# Patient Record
Sex: Female | Born: 1965 | Race: Black or African American | Hispanic: No | Marital: Married | Smoking: Never smoker
Health system: Southern US, Community
[De-identification: ages and names within clinical notes are randomized; demographics above are authoritative.]

## PROBLEM LIST (undated history)

## (undated) DIAGNOSIS — E079 Disorder of thyroid, unspecified: Secondary | ICD-10-CM

## (undated) DIAGNOSIS — I10 Essential (primary) hypertension: Secondary | ICD-10-CM

## (undated) DIAGNOSIS — N63 Unspecified lump in unspecified breast: Secondary | ICD-10-CM

## (undated) DIAGNOSIS — T7840XA Allergy, unspecified, initial encounter: Secondary | ICD-10-CM

## (undated) HISTORY — PX: WISDOM TOOTH EXTRACTION: SHX21

## (undated) HISTORY — DX: Disorder of thyroid, unspecified: E07.9

## (undated) HISTORY — DX: Essential (primary) hypertension: I10

## (undated) HISTORY — DX: Allergy, unspecified, initial encounter: T78.40XA

---

## 1998-08-13 ENCOUNTER — Other Ambulatory Visit: Admission: RE | Admit: 1998-08-13 | Discharge: 1998-08-13 | Payer: Self-pay | Admitting: Obstetrics and Gynecology

## 1998-08-31 ENCOUNTER — Other Ambulatory Visit: Admission: RE | Admit: 1998-08-31 | Discharge: 1998-08-31 | Payer: Self-pay | Admitting: *Deleted

## 1998-12-25 ENCOUNTER — Other Ambulatory Visit: Admission: RE | Admit: 1998-12-25 | Discharge: 1998-12-25 | Payer: Self-pay | Admitting: Obstetrics and Gynecology

## 1999-02-07 ENCOUNTER — Other Ambulatory Visit: Admission: RE | Admit: 1999-02-07 | Discharge: 1999-02-07 | Payer: Self-pay | Admitting: Obstetrics and Gynecology

## 1999-09-11 ENCOUNTER — Other Ambulatory Visit: Admission: RE | Admit: 1999-09-11 | Discharge: 1999-09-11 | Payer: Self-pay | Admitting: Obstetrics and Gynecology

## 2000-09-25 ENCOUNTER — Other Ambulatory Visit: Admission: RE | Admit: 2000-09-25 | Discharge: 2000-09-25 | Payer: Self-pay | Admitting: Obstetrics and Gynecology

## 2001-07-23 ENCOUNTER — Inpatient Hospital Stay (HOSPITAL_COMMUNITY): Admission: AD | Admit: 2001-07-23 | Discharge: 2001-07-23 | Payer: Self-pay | Admitting: Obstetrics & Gynecology

## 2001-08-01 ENCOUNTER — Inpatient Hospital Stay (HOSPITAL_COMMUNITY): Admission: AD | Admit: 2001-08-01 | Discharge: 2001-08-05 | Payer: Self-pay | Admitting: Obstetrics and Gynecology

## 2002-09-01 ENCOUNTER — Other Ambulatory Visit: Admission: RE | Admit: 2002-09-01 | Discharge: 2002-09-01 | Payer: Self-pay | Admitting: Obstetrics and Gynecology

## 2003-09-19 ENCOUNTER — Other Ambulatory Visit: Admission: RE | Admit: 2003-09-19 | Discharge: 2003-09-19 | Payer: Self-pay | Admitting: Obstetrics and Gynecology

## 2004-12-13 ENCOUNTER — Other Ambulatory Visit: Admission: RE | Admit: 2004-12-13 | Discharge: 2004-12-13 | Payer: Self-pay | Admitting: Obstetrics and Gynecology

## 2005-03-19 ENCOUNTER — Ambulatory Visit (HOSPITAL_COMMUNITY): Admission: RE | Admit: 2005-03-19 | Discharge: 2005-03-19 | Payer: Self-pay | Admitting: Obstetrics and Gynecology

## 2005-03-19 ENCOUNTER — Encounter (INDEPENDENT_AMBULATORY_CARE_PROVIDER_SITE_OTHER): Payer: Self-pay | Admitting: *Deleted

## 2005-12-26 ENCOUNTER — Other Ambulatory Visit: Admission: RE | Admit: 2005-12-26 | Discharge: 2005-12-26 | Payer: Self-pay | Admitting: Obstetrics and Gynecology

## 2006-09-28 ENCOUNTER — Inpatient Hospital Stay (HOSPITAL_COMMUNITY): Admission: AD | Admit: 2006-09-28 | Discharge: 2006-09-30 | Payer: Self-pay | Admitting: Obstetrics and Gynecology

## 2006-10-04 ENCOUNTER — Inpatient Hospital Stay (HOSPITAL_COMMUNITY): Admission: AD | Admit: 2006-10-04 | Discharge: 2006-10-07 | Payer: Self-pay | Admitting: Obstetrics and Gynecology

## 2007-08-04 ENCOUNTER — Encounter: Admission: RE | Admit: 2007-08-04 | Discharge: 2007-08-04 | Payer: Self-pay | Admitting: Obstetrics and Gynecology

## 2008-08-04 ENCOUNTER — Encounter: Admission: RE | Admit: 2008-08-04 | Discharge: 2008-08-04 | Payer: Self-pay | Admitting: Obstetrics and Gynecology

## 2009-08-06 ENCOUNTER — Encounter: Admission: RE | Admit: 2009-08-06 | Discharge: 2009-08-06 | Payer: Self-pay | Admitting: Obstetrics and Gynecology

## 2010-09-09 IMAGING — MG MM SCREEN MAMMOGRAM BILATERAL
4 series · 4 of 4 positions shown · non-contrast
Comparison: none

DG SCREEN MAMMOGRAM BILATERAL
Bilateral CC and MLO view(s) were taken.

DIGITAL SCREENING MAMMOGRAM WITH CAD:
There are scattered fibroglandular densities.  No masses or malignant type calcifications are 
identified.  Compared with prior studies.

[R CC]
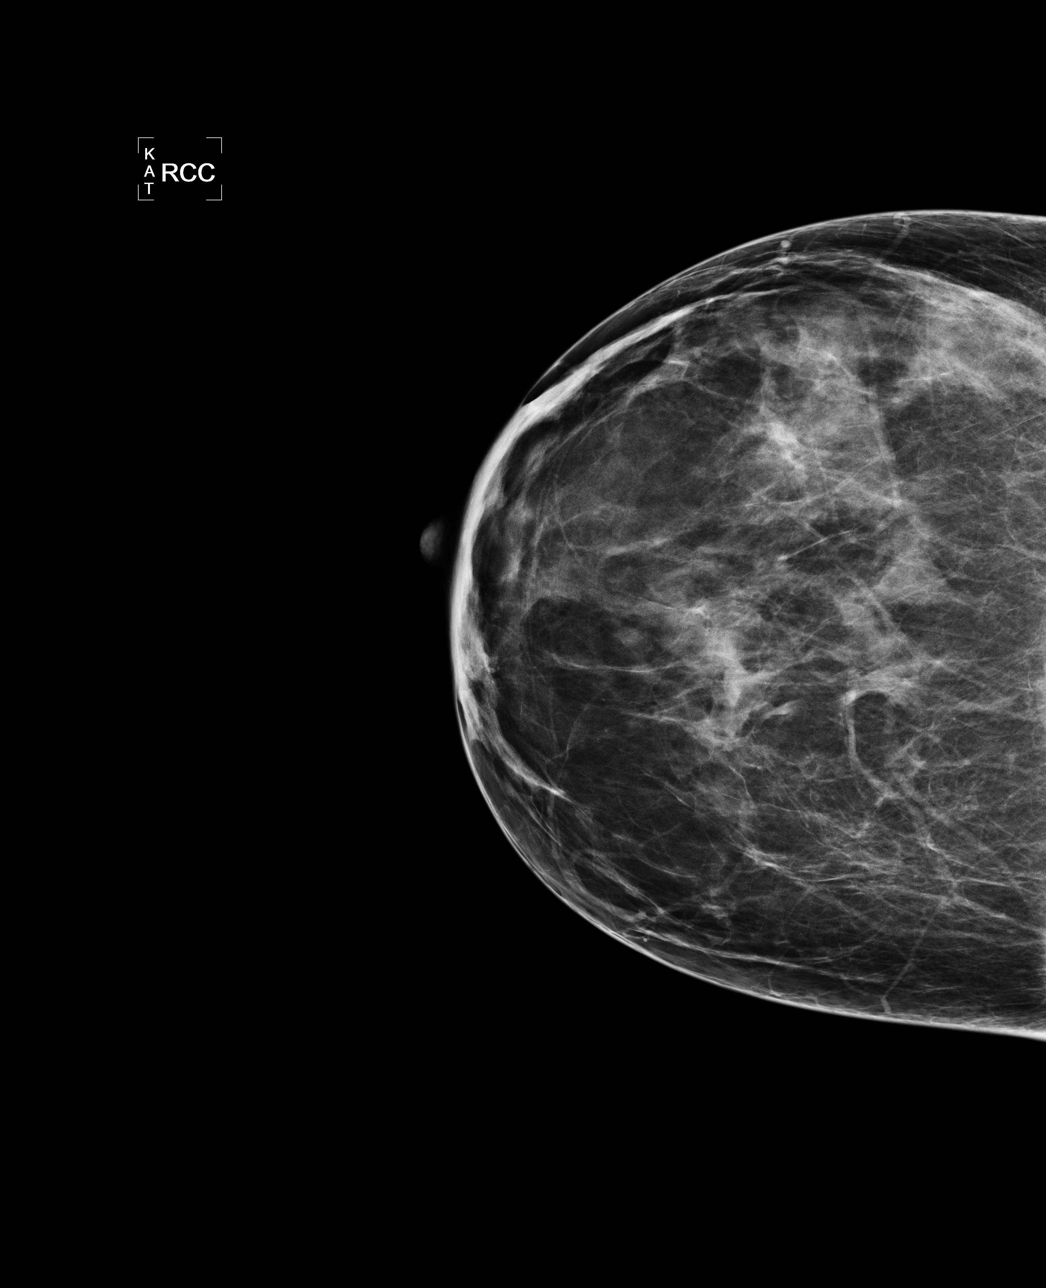

[L CC]
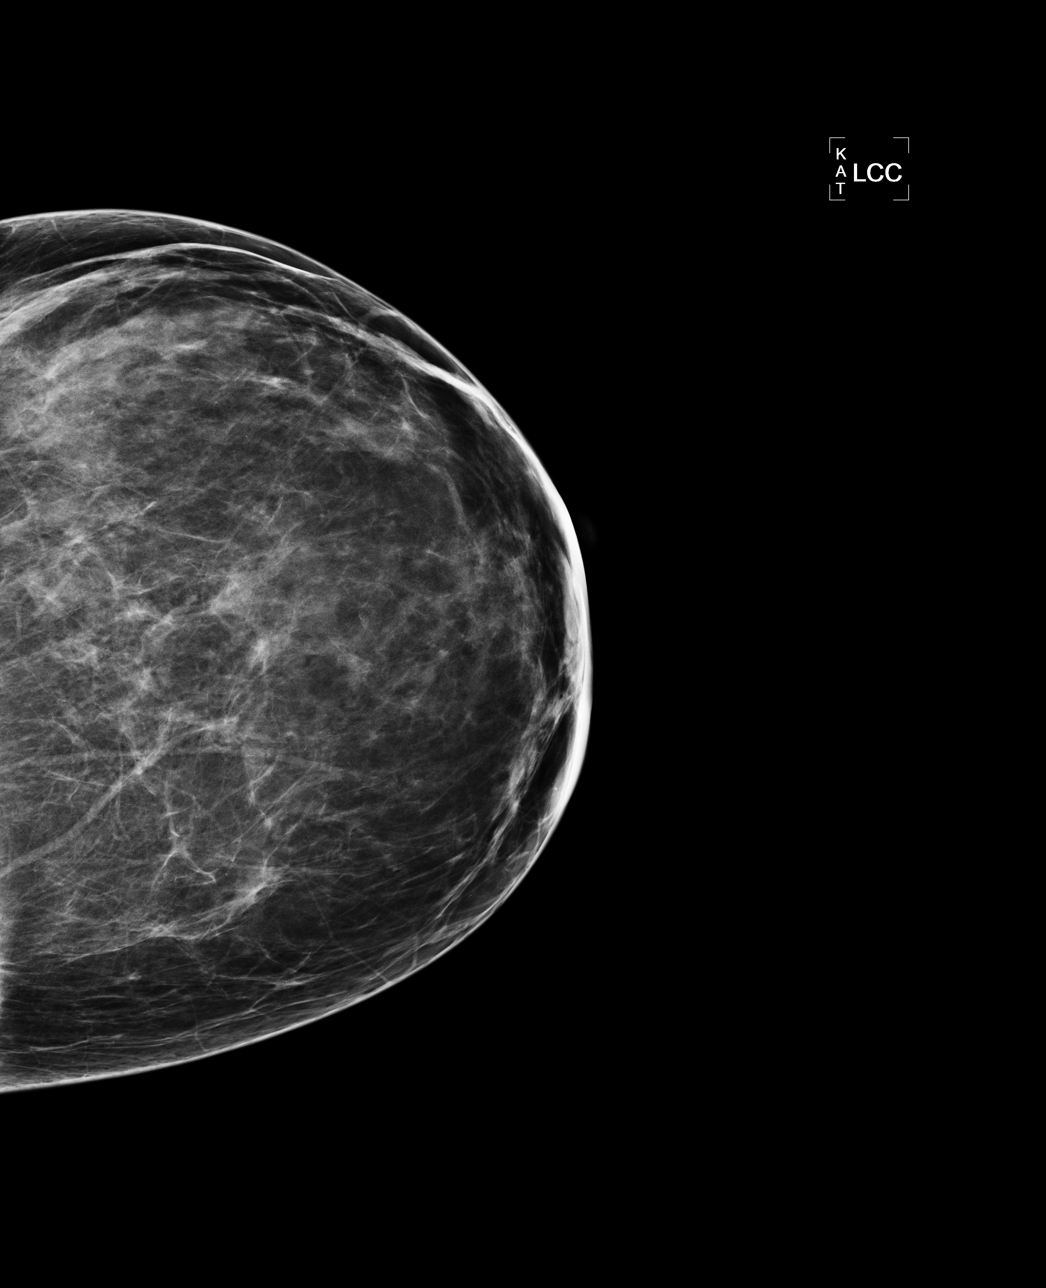

[L MLO]
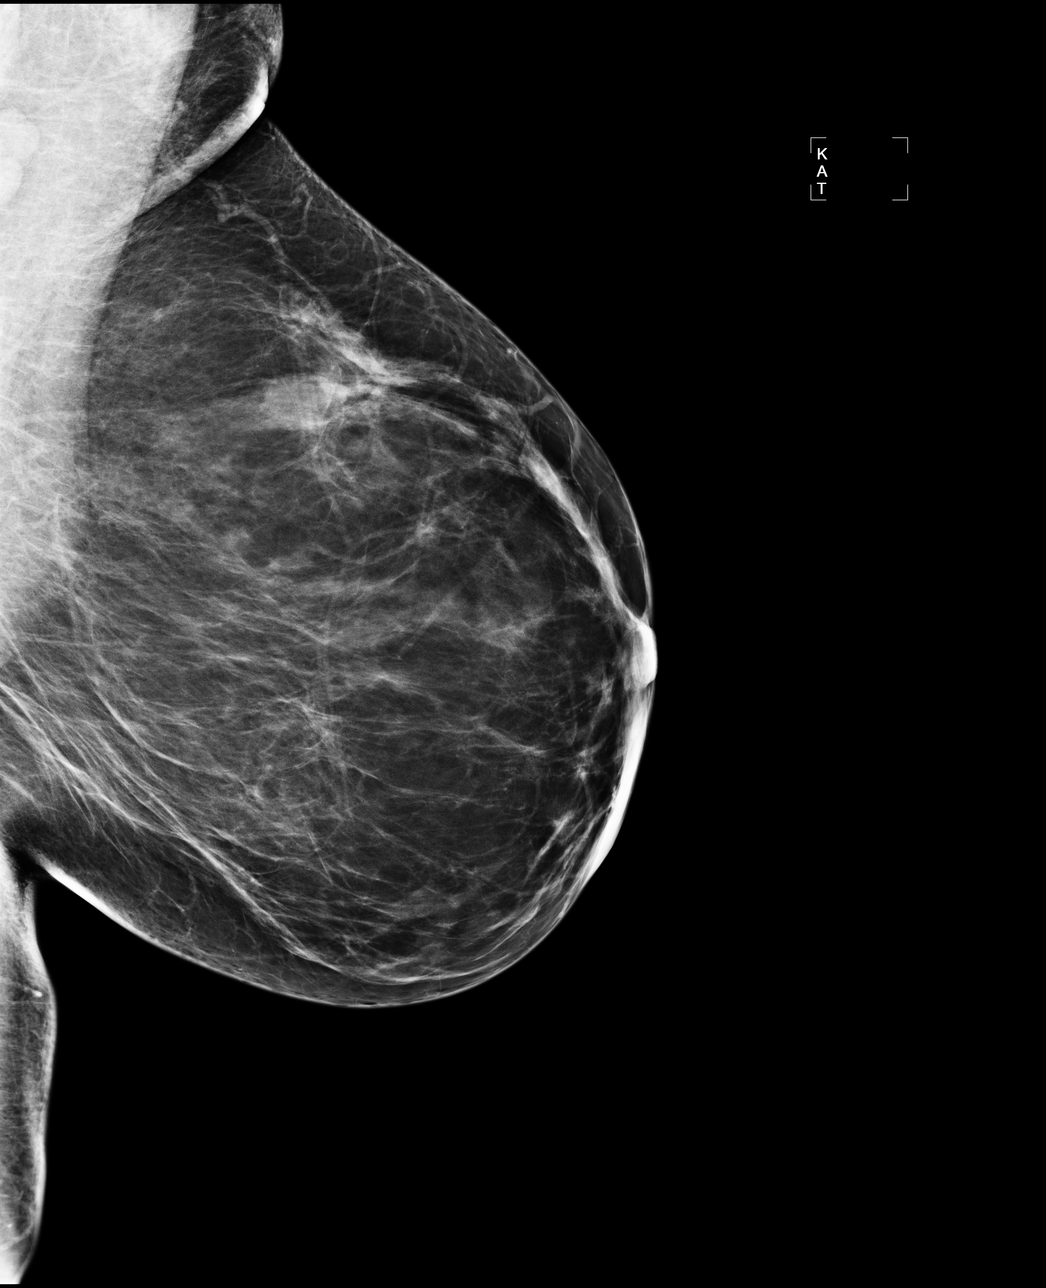

[R MLO]
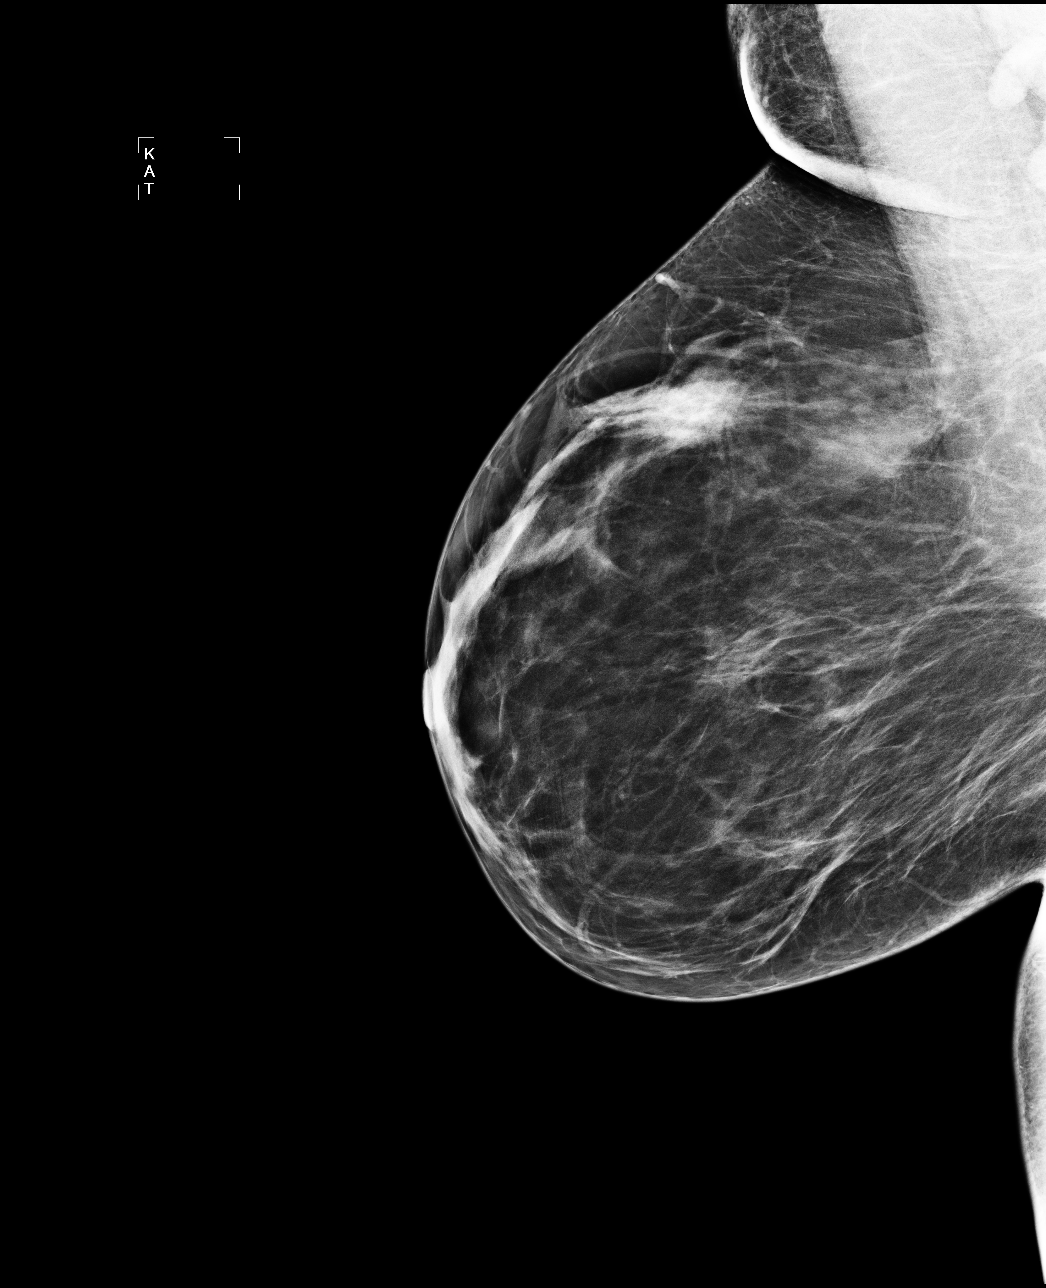

[4 of 4 positions shown; findings below may reference images not displayed]

IMPRESSION: No specific mammographic evidence of malignancy.  Next screening mammogram is recommended in one 
year.

ASSESSMENT: Negative - BI-RADS 1

Screening mammogram in 1 year.
ANALYZED BY COMPUTER AIDED DETECTION. , THIS PROCEDURE WAS A DIGITAL MAMMOGRAM.

## 2010-09-12 ENCOUNTER — Encounter: Admission: RE | Admit: 2010-09-12 | Discharge: 2010-09-12 | Payer: Self-pay | Admitting: Obstetrics and Gynecology

## 2011-04-11 NOTE — H&P (Signed)
[name], [name]                 ACCOUNT NO.:  [id number]   MEDICAL RECORD NO.:  [id number]          PATIENT TYPE:  INP   LOCATION:  9170                          FACILITY:  [facility]   PHYSICIAN:  Hal Morales, M.D.DATE OF BIRTH:  11-Apr-1966   DATE OF ADMISSION:  10/04/2006  DATE OF DISCHARGE:                              HISTORY & PHYSICAL   HISTORY OF PRESENT ILLNESS:  This is a 45 year old gravida 4, para 1-0-2-  1, at 97 and 0/7ths weeks who presents for elective induction of labor  secondary to oligohydramnios and chronic hypertension.   The pregnancy has been followed by Dr. Estanislado Pandy and remarkable for:  1. AMA.  2. History of anencephaly.  3. Fibroids.  4. History of abnormal Pap.  5. History of chronic hypertension.   ALLERGIES:  BACTRIM causes itching.   OBSTETRICAL HISTORY:  1. The patient had a vaginal delivery in 2002 of a female infant at [redacted]      weeks gestation, weighing 6 pounds, 10 ounces.  2. She had a spontaneous abortion in 2005.  3. She had an elective abortion at 19 weeks in 2006 for an ancephalic      infant.  4. The first pregnancy was induced for hypertension.   MEDICAL HISTORY:  1. Remarkable for chronic hypertension.  2. Abnormal Pap.  3. Childhood varicella.   SURGICAL HISTORY:  Remarkable for elective abortion in 2006.   FAMILY HISTORY:  Remarkable for mother, father, sister and aunt with  hypertension.  Mother and sister with varicosities.  Uncle with  diabetes.  Sister with thyroid disorder.  Father with Parkinson's.  Sister with breast cancer.  Sister with cervical cancer.  Grandmother  with unknown type of cancer.   GENETIC HISTORY:  Remarkable for cousin with cerebral palsy, and the  patient's 2006 infant who had anencephaly.  There is also a family  history of twins.   SOCIAL HISTORY:  The patient is married to [name], who is involved  and supportive.  She is of the [place] faith.  She denies any alcohol,  tobacco or drug  use.   PRENATAL LABORATORIES:  Hemoglobin of 12.8, platelets 332, blood type O  positive, antibody screen negative, sickle cell negative, RPR  nonreactive, rubella immune, hepatitis negative, HIV negative, Pap test  normal.   HISTORY OF CURRENT PREGNANCY:  The patient entered care at [redacted] weeks  gestation.  She had a normal nuchal translucency ultrasound at 12 weeks,  and the other first trimester screening was normal.  She declined  amniocentesis.  She had an ultrasound at 19 weeks which was normal,  except for a left kidney with pyelectasis and a posterior fibroid that  was noted.  Glucola at 29 weeks was elevated at 142 with a normal 3-hour  GTT.  She had NSTs starting at 32 weeks, which had been reassuring, and  she presented on September 28, 2006 with oligohydramnios with an AFI of  5.3, and was treated with IV hydration and discharged home 2 days later.   REVIEW OF SYSTEMS:  The patient has no complaints.  OBJECTIVE DATA:  HEENT:  Within normal limits.  Thyroid normal, not  enlarged.  CHEST:  Clear to auscultation.  HEART:  Heart rate regular rate and rhythm.  ABDOMEN:  Gravid.  Fetal heart rate shows reassuring fetal heart rate  with intermittent uterine contractions.  PELVIC:  Cervix closed and 50%.  EXTREMITIES:  Within normal limits.   ASSESSMENT:  1. Intrauterine pregnancy at 40 and 0/7ths weeks.  2. Oligohydramnios.  3. Chronic hypertension.   PLAN:  1. Admit to birthing suites per Dr. Estanislado Pandy.  2. Cytotec per vagina x3 doses.  3. Low-dose Pitocin in the morning.  4. Further orders to follow.      Marie L. Williams, C.N.M.      Hal Morales, M.D.  Electronically Signed    MLW/MEDQ  D:  10/04/2006  T:  10/05/2006  Job:  [id number]

## 2011-04-11 NOTE — Op Note (Signed)
[name], [name]                 ACCOUNT NO.:  [id number]   MEDICAL RECORD NO.:  [id number]          PATIENT TYPE:  AMB   LOCATION:  SDC                           FACILITY:  [facility]   PHYSICIAN:  Malva Limes, M.D.    DATE OF BIRTH:  06-Jul-1966   DATE OF PROCEDURE:  DATE OF DISCHARGE:                                 OPERATIVE REPORT   PREOPERATIVE DIAGNOSES:  1.  Intrauterine pregnancy at 43 weeks estimated gestational age.  2.  Anencephaly.   POSTOPERATIVE DIAGNOSES:  1.  Intrauterine pregnancy at 62 weeks estimated gestational age.  2.  Anencephaly.   PROCEDURE:  Dilation and evacuation.   ANESTHESIA:  General.   ANTIBIOTIC:  Ancef 1 g.   ESTIMATED BLOOD LOSS:  150 cc.   COMPLICATIONS:  None.   SPECIMENS:  Products of conception sent to pathology.  Also tissue sent for  genetic studies.   PROCEDURE:  The patient was taken to the operating room where a general  anesthetic was administered without complications.  She was then placed in  dorsal lithotomy position.  She was prepped with Hibiclens and draped in the  usual fashion for this procedure.  A sterile speculum was placed in the  vagina.  The previously placed laminaria removed.  A single-tooth tenaculum  was applied to the cervix.  The cervical os was then serially dilated.  The  membranes wee ruptured.  The 16 mm suction curette was placed into the  uterine cavity.  Following this, the large forceps was used to remove the  fetus.  A sharp curettage was ten performed followed by repeat suction.  The  patient appeared to have no tissue remaining in the uterine cavity at the  conclusion of this procedure.  There was no evidence of any perforation  during the procedure.  The patient did have a significant yeast infection  and this was being treated with Nizoral.  The patient was taken to the  recovery room in stable condition.  She will be discharged to home with  Keflex 500 mg q.i.d. for 3 days, Methergine 0.2 mg q.6h.  x6, and Anaprox  double strength p.r.n.      MA/MEDQ  D:  03/19/2005  T:  03/19/2005  Job:  [id number]

## 2011-04-11 NOTE — H&P (Signed)
NAMEROBERTA, Hale                 ACCOUNT NO.:  [id number]   MEDICAL RECORD NO.:  [id number]          PATIENT TYPE:  INP   LOCATION:  9157                          [facility]ACILITY:  [facility]   PHYSICIAN:  Crist Fat. Rivard, M.D. DATE OF BIRTH:  [birth year]   DATE OF ADMISSION:  09/28/2006  DATE OF DISCHARGE:                                HISTORY & PHYSICAL   This is a 45 year old gravida 4, para 1-0-2-1, who is 37-1/[redacted] weeks pregnant  who presents with oligohydramnios.  Plan is to give her IV hydration and bed  rest.  Pregnancy has been followed by Dr. Estanislado Pandy and remarkable for (1) AMA.  (2) History of anencephaly.  (3) Fibroids.  (4) History of abnormal Pap.  (5) History of chronic hypertension.   ALLERGIES:  BACTRIM causes itching.   OBSTETRICAL HISTORY:  1. Remarkable for vaginal delivery in 2002 of a female infant at 32 weeks'      gestation, weighing 6 pounds 10 ounces.  2. Hypertension.  3. She had an SAB in 2005.  4. She had an EAB at 19 weeks in 2006 for anencephalic infant.   MEDICAL HISTORY:  1. History of chronic hypertension.  2. History of abnormal Pap.  3. Childhood Varicella.   SURGICAL HISTORY:  Elective abortion in 2006.   FAMILY HISTORY:  Remarkable for mother, father, sister, and aunt with  hypertension.  Mother and sister with varicosities.  Uncle with diabetes.  Sister with thyroid disorder.  Father with Parkinson's.  Sister with breast  cancer.  A sister with cervical cancer.  Grandmother with unknown type of  cancer.   GENETIC HISTORY:  Remarkable for a cousin with cerebral palsy in the  patient's 2006 infant with anencephaly and other defects and a family  history of twins.   SOCIAL HISTORY:  The patient is married to [name] who is involved and  supportive.  She is of the [place] faith.  She denies any alcohol,  tobacco, or drug use.   PRENATAL LABS:  Hemoglobin 12.8, platelets 332, blood type O positive,  antibody screen negative, sickle cell  negative, RPR nonreactive, rubella  immune, hepatitis negative, HIV negative, Pap test normal.   HISTORY OF CURRENT PREGNANCY:  The patient entered care at 11 weeks'  gestation.  She had normal nuchal translucency ultrasound at 12 weeks, and  other first trimester screening was normal.  She declined an amnio.  She had  an ultrasound at 19 weeks which was normal except for a left kidney with  pyelectasis and a posterior fibroid that was noted.  Glucola at 29 weeks was  elevated at 142.  Three hour GTT was normal.  She had NSTs starting at 32  weeks which have been reassuring, and she presents today with  oligohydramnios with an AFI of 5.3.   REVIEW OF SYSTEMS:  The patient has no complaints.   OBJECTIVE DATA:  HEENT:  Within normal limits.  Thyroid normal and not  enlarged.  CHEST:  Clear to auscultation.  HEART:  Regular rate and rhythm.  ABDOMEN:  Gravid, vertex, Leopold's.  EFM shows  reassuring fetal heart rate  with no uterine contractions, and fetal heart rate is not reactive by  __________ criteria.  Cervix in the office was noted to be closed and 50%  effaced.  EXTREMITIES:  Within normal limits.   ASSESSMENT:  1. Intrauterine pregnancy at 37-1/7 weeks.  2. Oligohydramnios.  3. Unfavorable cervix.   PLAN:  1. Admit to antenatal.  2. IV hydration.  3. Bed rest.  4. Repeat ultrasound on Wednesday.      Marie L. Williams, C.N.M.      Crist Fat Rivard, M.D.  Electronically Signed    MLW/MEDQ  D:  09/28/2006  T:  09/28/2006  Job:  [id number]

## 2011-04-11 NOTE — Discharge Summary (Signed)
[facility]  Patient:    [name], [name] Visit Number: [id number] MRN: [id number]          Service Type: OBS Location: 910A [id number] [id number] Attending Physician:  Melony Overly Dictated by:   Gerrit Friends. Aldona Bar, M.D. Admit Date:  08/01/2001 Disc. Date: 08/05/01                             Discharge Summary  DISCHARGE DIAGNOSES:          1. Term pregnancy, delivered, 6-pound 10-ounce                                  female infant, Apgars of 6 and 8.                               2. Blood type O positive.  PROCEDURES:                   1. Vacuum extraction-assisted delivery.                               2. Second-degree tear and repair.  HISTORY OF PRESENT ILLNESS:   This gravida 2, para 0, was admitted at 38 weeks for induction because of pregnancy-induced hypertension.  At the time of admission her blood pressure was 140/97.  Her cervix was closed, 70% effaced, with a vertex presenting at about -2, and the cervix was posterior.  HOSPITAL COURSE:              Initially Cytotec was placed and, thereafter, Pitocin was used.  Amniotomy was carried out on the morning of August 02, 2001, with production of clear fluid.  The patients blood pressures were between 140 and 150/100-110.  During the course of August 02, 2001, she labored and became completely dilated in the early morning of August 03, 2001.  During her second stage she became febrile and, although good beat-to-beat variability was noted, there were some late-onset decelerations noted.  She subsequently required the assistance of a vacuum extractor for delivery and was delivered of a 6-pound, 10-ounce female infant with Apgars of 6 and 8, over a second-degree tear.  Arterial cord pH was 7.13.  Apgars were 6 and 8.  The tear was repaired, and the patients postpartum course was totally uncomplicated.  On the morning of August 05, 2001, she was ambulating well, tolerating a regular diet well,  having normal bowel and bladder function, was afebrile.  Her discharge hemoglobin was 11.7 with a white count of 18,700, and a platelet count of 275,000.  As mentioned, she was breast-feeding without difficulty at the time of discharge.  DISCHARGE MEDICATIONS:        1. Tylox 1-2 every four to six hours as needed                                  for severe pain.                               2. Motrin 600 mg every six hours as needed for  pain.                               3. She will also continue on her prenatal                                  vitamins, 1 a day.  FOLLOW-UP:                    She will return to the office in approximately four weeks time.  DISCHARGE INSTRUCTIONS:       For external hemorrhoids that were somewhat symptomatic, she will use Anusol-HC 1 ointment (over-the-counter).  She was given all appropriate instructions at the time of discharge and did understand all instructions well.  CONDITION ON DISCHARGE:       Improved. Dictated by:   Gerrit Friends. Aldona Bar, M.D. Attending Physician:  Melony Overly DD:  08/05/01 TD:  08/05/01 Job: [id number]

## 2011-04-11 NOTE — Discharge Summary (Signed)
[name], [name]                 ACCOUNT NO.:  [id number]   MEDICAL RECORD NO.:  [id number]          PATIENT TYPE:  INP   LOCATION:  9157                          FACILITY:  [facility]   PHYSICIAN:  Naima A. Dillard, M.D. DATE OF BIRTH:  12/03/1965   DATE OF ADMISSION:  09/28/2006  DATE OF DISCHARGE:  09/30/2006                                 DISCHARGE SUMMARY   ADMISSION DIAGNOSES:  1. Intrauterine pregnancy at 37-1/7 weeks.  2. Oligohydramnios.  3. Unfavorable cervix.   DISCHARGE DIAGNOSES:  1. Intrauterine pregnancy at 37-1/7 weeks.  2. Oligohydramnios, resolved.  3. Unfavorable cervix.   HOSPITAL COURSE:  [name]  is a 45 year old gravida 4, para 1-0-2-1 who  presented at 37-1/7 weeks with oligohydramnios.  Her pregnancy had been  followed by [facility] and was remarkable for:  1.)  Advanced maternal age.  2.)  History of anencephaly.  3.)  Fibroids.  4.)  History of abnormal Pap.  5.)  History of chronic  hypertension.   Upon admission her vital signs are stable.  Electronic fetal monitoring  showed reassuring fetal heart rate tracing in the 140-150 range.  No uterine  contractions.  AFI was 5.3.  Cervix was closed and 50%.  Sterile speculum  exam was negative for rupture.  The patient was admitted to antenatal for IV  hydration and repeat ultrasound on November 7.   On her first hospital day, her vital signs are stable.  Blood pressure was  134/87.  She was having accelerations on her tracing.  No decelerations and  positive variability.  Rare contractions.  Her hypertension was well-  controlled.  She continued on IV fluids and rest.  On hospital day 2, she  was still feeling well.  Her vital signs were stable.  Blood pressure was  147/94.  She was currently off labetalol due to a hypotensive episode before  admission.  Group B strep was collected and it was negative.  Ultrasound  that was done on November 7 showed AFI at 9.8 cm, which is in the  normal  decreased range but still above the 5th percentile.  BPP was 8 out of 8,  placenta grade 1 and anterior.   Due to the increase in the amniotic fluid level, plan was made for the  patient to be discharged, and she is scheduled for cervical ripening and  induction of labor this coming Sunday, November 11, by Dr. Estanislado Pandy, who is  her primary care physician.   DISCHARGE INSTRUCTIONS:  Labor precautions are reviewed with the patient and  fetal kick count instructions were given.   DISCHARGE MEDICATIONS:  Prenatal vitamins, one p.o. daily.   FOLLOWUP:  Discharge followup will occur on Sunday for her induction or as  needed.      Cam Hai, C.N.M.      Naima A. Normand Sloop, M.D.  Electronically Signed    KS/MEDQ  D:  09/30/2006  T:  10/01/2006  Job:  [id number]

## 2011-09-08 ENCOUNTER — Other Ambulatory Visit: Payer: Self-pay | Admitting: Obstetrics and Gynecology

## 2011-09-08 DIAGNOSIS — Z1231 Encounter for screening mammogram for malignant neoplasm of breast: Secondary | ICD-10-CM

## 2011-10-14 ENCOUNTER — Ambulatory Visit
Admission: RE | Admit: 2011-10-14 | Discharge: 2011-10-14 | Disposition: A | Discharge status: Home / Self Care | Payer: BC Managed Care – PPO | Source: Ambulatory Visit | Attending: Obstetrics and Gynecology | Admitting: Obstetrics and Gynecology

## 2011-10-14 DIAGNOSIS — Z1231 Encounter for screening mammogram for malignant neoplasm of breast: Secondary | ICD-10-CM

## 2012-08-23 ENCOUNTER — Ambulatory Visit: Payer: Self-pay | Admitting: Obstetrics and Gynecology

## 2012-08-26 ENCOUNTER — Encounter: Payer: Self-pay | Admitting: Obstetrics and Gynecology

## 2012-08-26 ENCOUNTER — Ambulatory Visit (INDEPENDENT_AMBULATORY_CARE_PROVIDER_SITE_OTHER): Payer: BC Managed Care – PPO | Admitting: Obstetrics and Gynecology

## 2012-08-26 VITALS — BP 120/78 | Ht 64.0 in | Wt 141.0 lb

## 2012-08-26 DIAGNOSIS — Z01419 Encounter for gynecological examination (general) (routine) without abnormal findings: Secondary | ICD-10-CM

## 2012-08-26 DIAGNOSIS — I10 Essential (primary) hypertension: Secondary | ICD-10-CM

## 2012-08-26 NOTE — Progress Notes (Signed)
The patient reports:normal menses, no abnormal bleeding, pelvic pain or discharge  Contraception:condoms  Last mammogram: was normal November 2012 Last pap: was normal September  2012  GC/Chlamydia cultures offered: declined HIV/RPR/HbsAg offered:  declined HSV 1 and 2 glycoprotein offered: declined  Menstrual cycle regular and monthly: Yes Menstrual flow normal: Yes  Urinary symptoms: none Normal bowel movements: Yes Reports abuse at home: No:  Pt stated no issues today. Subjective:    [name] is a 46 y.o. female, No obstetric history on file., who presents for an annual exam.     History   Social History  . Marital Status: Married    Spouse Name: N/A    Number of Children: N/A  . Years of Education: N/A   Social History Main Topics  . Smoking status: Never Smoker   . Smokeless tobacco: Never Used  . Alcohol Use: No  . Drug Use:   . Sexually Active: Yes    Birth Control/ Protection: Condom   Other Topics Concern  . None   Social History Narrative  . None    Menstrual cycle:   LMP: Patient's last menstrual period was 08/14/2012.           Cycle: normal  The following portions of the patient's history were reviewed and updated as appropriate: allergies, current medications, past family history, past medical history, past social history, past surgical history and problem list.  Review of Systems Pertinent items are noted in HPI. Breast:Negative for breast lump,nipple discharge or nipple retraction Gastrointestinal: Negative for abdominal pain, change in bowel habits or rectal bleeding Urinary:negative   Objective:    BP 120/78  Ht 5\' 4"  (1.626 m)  Wt 141 lb (63.957 kg)  BMI 24.20 kg/m2  LMP 08/14/2012    Weight:  Wt Readings from Last 1 Encounters:  08/26/12 141 lb (63.957 kg)          BMI: Body mass index is 24.20 kg/(m^2).  General Appearance: Alert, appropriate appearance for age. No acute distress HEENT: Grossly normal Neck / Thyroid:  Supple, no masses, nodes or enlargement Lungs: clear to auscultation bilaterally Back: No CVA tenderness Breast Exam: No masses or nodes.No dimpling, nipple retraction or discharge. Cardiovascular: Regular rate and rhythm. S1, S2, no murmur Gastrointestinal: Soft, non-tender, no masses or organomegaly Pelvic Exam: Vulva and vagina appear normal. Bimanual exam reveals normal uterus and adnexa.AV Rectovaginal: normal rectal, no masses Lymphatic Exam: Non-palpable nodes in neck, clavicular, axillary, or inguinal regions Skin: no rash or abnormalities Neurologic: Normal gait and speech, no tremor  Psychiatric: Alert and oriented, appropriate affect.   Wet Prep:not applicable Urinalysis:not applicable UPT: Not done   Assessment:    Normal gyn exam    Plan:    mammogram pap smear return annually or prn STD screening: declined Contraception:  Mirena IUD discussed.  Risks and benefits explained.  Information given at check out. Pt to decide and will call with decision.

## 2012-08-27 LAB — PAP IG W/ RFLX HPV ASCU

## 2012-09-27 ENCOUNTER — Other Ambulatory Visit: Payer: Self-pay | Admitting: Obstetrics and Gynecology

## 2012-09-27 DIAGNOSIS — Z1231 Encounter for screening mammogram for malignant neoplasm of breast: Secondary | ICD-10-CM

## 2012-10-19 ENCOUNTER — Ambulatory Visit (HOSPITAL_BASED_OUTPATIENT_CLINIC_OR_DEPARTMENT_OTHER)
Admission: RE | Admit: 2012-10-19 | Discharge: 2012-10-19 | Disposition: A | Discharge status: Home / Self Care | Payer: BC Managed Care – PPO | Source: Ambulatory Visit | Attending: Obstetrics and Gynecology | Admitting: Obstetrics and Gynecology

## 2012-10-19 ENCOUNTER — Ambulatory Visit: Payer: BC Managed Care – PPO

## 2012-10-19 DIAGNOSIS — Z1231 Encounter for screening mammogram for malignant neoplasm of breast: Secondary | ICD-10-CM | POA: Insufficient documentation

## 2013-11-11 ENCOUNTER — Other Ambulatory Visit: Payer: Self-pay | Admitting: Obstetrics and Gynecology

## 2013-11-11 DIAGNOSIS — Z1231 Encounter for screening mammogram for malignant neoplasm of breast: Secondary | ICD-10-CM

## 2013-11-21 ENCOUNTER — Ambulatory Visit (HOSPITAL_BASED_OUTPATIENT_CLINIC_OR_DEPARTMENT_OTHER)
Admission: RE | Admit: 2013-11-21 | Discharge: 2013-11-21 | Disposition: A | Discharge status: Home / Self Care | Payer: BC Managed Care – PPO | Source: Ambulatory Visit | Attending: Obstetrics and Gynecology | Admitting: Obstetrics and Gynecology

## 2013-11-21 DIAGNOSIS — Z1231 Encounter for screening mammogram for malignant neoplasm of breast: Secondary | ICD-10-CM | POA: Insufficient documentation

## 2014-09-25 ENCOUNTER — Encounter: Payer: Self-pay | Admitting: Obstetrics and Gynecology

## 2015-01-16 ENCOUNTER — Other Ambulatory Visit (HOSPITAL_BASED_OUTPATIENT_CLINIC_OR_DEPARTMENT_OTHER): Payer: Self-pay | Admitting: Obstetrics and Gynecology

## 2015-01-16 DIAGNOSIS — Z1231 Encounter for screening mammogram for malignant neoplasm of breast: Secondary | ICD-10-CM

## 2015-01-23 ENCOUNTER — Ambulatory Visit (HOSPITAL_BASED_OUTPATIENT_CLINIC_OR_DEPARTMENT_OTHER)
Admission: RE | Admit: 2015-01-23 | Discharge: 2015-01-23 | Disposition: A | Discharge status: Home / Self Care | Payer: BLUE CROSS/BLUE SHIELD | Source: Ambulatory Visit | Attending: Obstetrics and Gynecology | Admitting: Obstetrics and Gynecology

## 2015-01-23 DIAGNOSIS — Z1231 Encounter for screening mammogram for malignant neoplasm of breast: Secondary | ICD-10-CM | POA: Insufficient documentation

## 2016-01-03 DIAGNOSIS — M7989 Other specified soft tissue disorders: Secondary | ICD-10-CM | POA: Insufficient documentation

## 2016-06-25 ENCOUNTER — Other Ambulatory Visit: Payer: Self-pay | Admitting: Obstetrics and Gynecology

## 2016-06-25 DIAGNOSIS — Z1231 Encounter for screening mammogram for malignant neoplasm of breast: Secondary | ICD-10-CM

## 2016-07-02 ENCOUNTER — Telehealth (HOSPITAL_COMMUNITY): Payer: Self-pay | Admitting: *Deleted

## 2016-07-02 NOTE — Telephone Encounter (Signed)
Telephoned patient at home # to confirm appointment with [facility]. Patient canceled appointment. [facility] Patient voiced understanding.

## 2016-07-03 ENCOUNTER — Ambulatory Visit (HOSPITAL_COMMUNITY): Payer: BLUE CROSS/BLUE SHIELD

## 2016-09-04 DIAGNOSIS — E041 Nontoxic single thyroid nodule: Secondary | ICD-10-CM | POA: Insufficient documentation

## 2017-02-02 ENCOUNTER — Encounter (HOSPITAL_BASED_OUTPATIENT_CLINIC_OR_DEPARTMENT_OTHER): Payer: Self-pay | Admitting: *Deleted

## 2017-02-02 ENCOUNTER — Emergency Department (HOSPITAL_BASED_OUTPATIENT_CLINIC_OR_DEPARTMENT_OTHER)
Admission: EM | Admit: 2017-02-02 | Discharge: 2017-02-02 | Disposition: A | Discharge status: Home / Self Care | Payer: BLUE CROSS/BLUE SHIELD | Attending: Emergency Medicine | Admitting: Emergency Medicine

## 2017-02-02 ENCOUNTER — Emergency Department (HOSPITAL_BASED_OUTPATIENT_CLINIC_OR_DEPARTMENT_OTHER): Payer: BLUE CROSS/BLUE SHIELD

## 2017-02-02 DIAGNOSIS — R002 Palpitations: Secondary | ICD-10-CM | POA: Diagnosis not present

## 2017-02-02 DIAGNOSIS — I10 Essential (primary) hypertension: Secondary | ICD-10-CM | POA: Insufficient documentation

## 2017-02-02 DIAGNOSIS — Z79899 Other long term (current) drug therapy: Secondary | ICD-10-CM | POA: Diagnosis not present

## 2017-02-02 LAB — COMPREHENSIVE METABOLIC PANEL
ALT: 15 U/L (ref 14–54)
AST: 23 U/L (ref 15–41)
Albumin: 4.1 g/dL (ref 3.5–5.0)
Alkaline Phosphatase: 59 U/L (ref 38–126)
Anion gap: 7 (ref 5–15)
BUN: 9 mg/dL (ref 6–20)
CHLORIDE: 104 mmol/L (ref 101–111)
CO2: 27 mmol/L (ref 22–32)
CREATININE: 0.7 mg/dL (ref 0.44–1.00)
Calcium: 9.2 mg/dL (ref 8.9–10.3)
GFR calc Af Amer: >60 mL/min (ref >60)
GFR calc non Af Amer: >60 mL/min (ref >60)
Glucose, Bld: 86 mg/dL (ref 65–99)
POTASSIUM: 3.5 mmol/L (ref 3.5–5.1)
SODIUM: 138 mmol/L (ref 135–145)
Total Bilirubin: 0.4 mg/dL (ref 0.3–1.2)
Total Protein: 7.6 g/dL (ref 6.5–8.1)

## 2017-02-02 LAB — CBC
HCT: 37.5 % (ref 36.0–46.0)
Hemoglobin: 12.4 g/dL (ref 12.0–15.0)
MCH: 29.7 pg (ref 26.0–34.0)
MCHC: 33.1 g/dL (ref 30.0–36.0)
MCV: 89.9 fL (ref 78.0–100.0)
PLATELETS: 303 10*3/uL (ref 150–400)
RBC: 4.17 MIL/uL (ref 3.87–5.11)
RDW: 13 % (ref 11.5–15.5)
WBC: 6.7 10*3/uL (ref 4.0–10.5)

## 2017-02-02 LAB — PREGNANCY, URINE: Preg Test, Ur: NEGATIVE

## 2017-02-02 LAB — TROPONIN I

## 2017-02-02 NOTE — ED Provider Notes (Signed)
[facility] Provider Note   CSN: [id number] Arrival date & time: 02/02/17  1858  By signing my name below, I, Modena Jansky, attest that this documentation has been prepared under the direction and in the presence of Nira Conn, MD. Electronically Signed: Modena Jansky, Scribe. 02/02/2017. 8:50 PM.  History   Chief Complaint Chief Complaint  Patient presents with  . Palpitations   The history is provided by the patient. No language interpreter was used.   HPI Comments: [name] is a 51 y.o. female who presents to the Emergency Department complaining of intermittent heart palpitations that started about 3 hours ago. She states she started her heart beat was faster and "harder" right after eating. She recent had her Metoprolol succinate dose increased 2 days ago. She has been drinking tea lately and is currently on a diuretic medication. She reports associated tingling to left side and bilateral hands. She admits to a hx of thyroid nodules. She denies any recent surgeries/travel, hx of cancer, hormone therapy use, fever, chest pain, SOB, vomiting, dehydration, or other complaints.    PCP: Burnis Medin, PA-C  Past Medical History:  Diagnosis Date  . Allergy   . Hypertension     Patient Active Problem List   Diagnosis Date Noted  . Hypertension 08/26/2012    Past Surgical History:  Procedure Laterality Date  . WISDOM TOOTH EXTRACTION      OB History    Gravida Para Term Preterm AB Living   4 2     2      SAB TAB Ectopic Multiple Live Births   2               Home Medications    Prior to Admission medications   Medication Sig Start Date End Date Taking? Authorizing Provider  HYDROCHLOROTHIAZIDE PO Take by mouth.   Yes Historical Provider, MD  metoprolol succinate (TOPROL-XL) 100 MG 24 hr tablet Take 100 mg by mouth daily. Take with or immediately following a meal.   Yes Historical Provider, MD  benazepril-hydrochlorthiazide (LOTENSIN  HCT) 20-25 MG per tablet Take 1 tablet by mouth daily.    Historical Provider, MD    Family History Family History  Problem Relation Age of Onset  . Hypertension Mother   . Hypertension Father   . Parkinsonism Father   . Breast cancer Sister 17  . Hypertension Paternal Grandmother   . Hypertension Paternal Grandfather     Social History Social History  Substance Use Topics  . Smoking status: Never Smoker  . Smokeless tobacco: Never Used  . Alcohol use No     Allergies   Bactrim [sulfamethoxazole-trimethoprim] and Seasonal ic [cholestatin]   Review of Systems Review of Systems A complete 10 system review of systems was obtained and all systems are negative except as noted in the HPI and PMH.   Physical Exam Updated Vital Signs BP 130/76 (BP Location: Left Arm)   Pulse (!) 58   Temp 98.7 F (37.1 C) (Oral)   Resp 18   Ht 5\' 3"  (1.6 m)   Wt 135 lb (61.2 kg)   LMP 01/11/2017   SpO2 100%   BMI 23.91 kg/m   Physical Exam  Constitutional: She is oriented to person, place, and time. She appears well-developed and well-nourished. No distress.  HENT:  Head: Normocephalic and atraumatic.  Nose: Nose normal.  Eyes: Conjunctivae and EOM are normal. Pupils are equal, round, and reactive to light. Right eye exhibits no discharge. Left  eye exhibits no discharge. No scleral icterus.  Neck: Normal range of motion. Neck supple. No thyroid mass and no thyromegaly present.  Cardiovascular: Normal rate and regular rhythm.  Exam reveals no gallop and no friction rub.   No murmur heard. Pulmonary/Chest: Effort normal and breath sounds normal. No stridor. No respiratory distress. She has no rales.  Abdominal: Soft. She exhibits no distension. There is no tenderness.  Musculoskeletal: She exhibits no edema or tenderness.  Neurological: She is alert and oriented to person, place, and time.  Skin: Skin is warm and dry. No rash noted. She is not diaphoretic. No erythema.  Psychiatric:  She has a normal mood and affect.  Vitals reviewed.    ED Treatments / Results  DIAGNOSTIC STUDIES: Oxygen Saturation is 100% on RA, normal by my interpretation.    COORDINATION OF CARE: 8:54 PM- Pt advised of plan for treatment and pt agrees.  Labs (all labs ordered are listed, but only abnormal results are displayed) Labs Reviewed  CBC  TROPONIN I  PREGNANCY, URINE  COMPREHENSIVE METABOLIC PANEL  TSH  T4, FREE    EKG  EKG Interpretation  Date/Time:  Monday February 02 2017 19:03:19 EDT Ventricular Rate:  91 PR Interval:  132 QRS Duration: 74 QT Interval:  368 QTC Calculation: 452 R Axis:   34 Text Interpretation:  Normal sinus rhythm Right atrial enlargement Low voltage QRS Borderline ECG No STEMI No old tracing to compare Confirmed by [facility] MD, Mellony Danziger [phone]) on 02/02/2017 8:25:04 PM       Radiology Dg Chest 2 View  Result Date: 02/02/2017 CLINICAL DATA:  Palpitations EXAM: CHEST  2 VIEW COMPARISON:  None. FINDINGS: The heart size and mediastinal contours are within normal limits. Both lungs are clear. The visualized skeletal structures are unremarkable. IMPRESSION: No active cardiopulmonary disease. Electronically Signed   By: Deatra Robinson M.D.   On: 02/02/2017 19:55    Procedures Procedures (including critical care time)  Medications Ordered in ED Medications - No data to display   Initial Impression / Assessment and Plan / ED Course  I have reviewed the triage vital signs and the nursing notes.  Pertinent labs & imaging results that were available during my care of the patient were reviewed by me and considered in my medical decision making (see chart for details).     Etiology of patient's palpitations undetermined at this time. EKG without acute ischemic changes, arrhythmias, or blocks. No electrolyte derangements. CBC without anemia. Low suspicion for pulmonary embolism.  Patient with a history of thyroid nodule with normal thyroid study panel year  ago. Will send repeat today however will not be resulted prior to discharge. Patient's primary care provider is with [facility] should have access to care everywhere for follow-up.  UPT negative.  Asymptomatic with no dysrhythmias noted on telemetry throughout stay. Safe for discharge with strict return precautions.  Final Clinical Impressions(s) / ED Diagnoses   Final diagnoses:  Palpitations   Disposition: Discharge  Condition: Good  I have discussed the results, Dx and Tx plan with the patient who expressed understanding and agree(s) with the plan. Discharge instructions discussed at great length. The patient was given strict return precautions who verbalized understanding of the instructions. No further questions at time of discharge.    Discharge Medication List as of 02/02/2017 11:17 PM      Follow Up: Burnis Medin, PA-C [phone] Laurell [address] [place] [address] [phone]  Schedule an appointment as soon as possible for  a visit  As needed   I personally performed the services described in this documentation, which was scribed in my presence. The recorded information has been reviewed and is accurate.        Nira ConnPedro Eduardo Kurt Azimi, MD 02/03/17 [phone]

## 2017-02-02 NOTE — ED Triage Notes (Signed)
Heart palpitations for an hour.

## 2017-02-02 NOTE — ED Notes (Signed)
Patient transported to X-ray 

## 2017-02-03 LAB — TSH: TSH: 1.139 u[IU]/mL (ref 0.350–4.500)

## 2017-02-03 LAB — T4, FREE: FREE T4: 1.05 ng/dL (ref 0.61–1.12)

## 2017-03-18 ENCOUNTER — Other Ambulatory Visit: Payer: Self-pay | Admitting: Obstetrics and Gynecology

## 2017-03-18 DIAGNOSIS — N6310 Unspecified lump in the right breast, unspecified quadrant: Principal | ICD-10-CM

## 2017-03-18 DIAGNOSIS — N6315 Unspecified lump in the right breast, overlapping quadrants: Secondary | ICD-10-CM

## 2017-03-23 ENCOUNTER — Ambulatory Visit
Admission: RE | Admit: 2017-03-23 | Discharge: 2017-03-23 | Disposition: A | Discharge status: Home / Self Care | Payer: BLUE CROSS/BLUE SHIELD | Source: Ambulatory Visit | Attending: Obstetrics and Gynecology | Admitting: Obstetrics and Gynecology

## 2017-03-23 DIAGNOSIS — N6315 Unspecified lump in the right breast, overlapping quadrants: Secondary | ICD-10-CM

## 2017-03-23 DIAGNOSIS — N6310 Unspecified lump in the right breast, unspecified quadrant: Principal | ICD-10-CM

## 2017-03-23 HISTORY — DX: Unspecified lump in unspecified breast: N63.0

## 2017-09-15 ENCOUNTER — Encounter: Payer: Self-pay | Admitting: Gastroenterology

## 2017-10-27 ENCOUNTER — Other Ambulatory Visit: Payer: Self-pay

## 2017-10-27 ENCOUNTER — Ambulatory Visit: Payer: BLUE CROSS/BLUE SHIELD

## 2017-10-27 VITALS — Ht 64.0 in | Wt 143.8 lb

## 2017-10-27 DIAGNOSIS — Z1211 Encounter for screening for malignant neoplasm of colon: Secondary | ICD-10-CM

## 2017-10-27 MED ORDER — PEG-KCL-NACL-NASULF-NA ASC-C 140 G PO SOLR: 1.0000 | Freq: Once | ORAL | 0 refills | Status: AC

## 2017-10-27 NOTE — Progress Notes (Signed)
Denies allergies to eggs or soy products. Denies complication of anesthesia or sedation. Denies use of weight loss medication. Denies use of O2.   Emmi instructions given for colonoscopy.  

## 2017-11-05 ENCOUNTER — Telehealth: Payer: Self-pay | Admitting: Gastroenterology

## 2017-11-05 NOTE — Telephone Encounter (Signed)
Spoke with pt and told her she can medications the day of her procedure, before 7:30 am.  No further questions at this time

## 2017-11-10 ENCOUNTER — Encounter: Payer: Self-pay | Admitting: Gastroenterology

## 2017-11-10 ENCOUNTER — Ambulatory Visit (AMBULATORY_SURGERY_CENTER): Payer: BLUE CROSS/BLUE SHIELD | Admitting: Gastroenterology

## 2017-11-10 VITALS — BP 121/75 | HR 83 | Temp 96.8°F | Resp 15 | Ht 64.0 in | Wt 143.0 lb

## 2017-11-10 DIAGNOSIS — Z1211 Encounter for screening for malignant neoplasm of colon: Secondary | ICD-10-CM

## 2017-11-10 DIAGNOSIS — Z1212 Encounter for screening for malignant neoplasm of rectum: Secondary | ICD-10-CM

## 2017-11-10 MED ORDER — SODIUM CHLORIDE 0.9 % IV SOLN: 500.0000 mL | INTRAVENOUS | Status: DC

## 2017-11-10 NOTE — Progress Notes (Signed)
Spontaneous respirations throughout. VSS. Resting comfortably. To PACU on room air. Report to  RN. 

## 2017-11-10 NOTE — Op Note (Signed)
[facility] Patient Name: [name] Procedure Date: 11/10/2017 10:54 AM MRN: [id number] Endoscopist: Napoleon FormKavitha V. Olga Seyler , MD Age: 51 Referring MD:  Date of Birth: 11/24/1965 Gender: Female Account #: [id number] Procedure:                Colonoscopy Indications:              Screening for colorectal malignant neoplasm Medicines:                Monitored Anesthesia Care Procedure:                Pre-Anesthesia Assessment:                           - Prior to the procedure, a History and Physical                            was performed, and patient medications and                            allergies were reviewed. The patient's tolerance of                            previous anesthesia was also reviewed. The risks                            and benefits of the procedure and the sedation                            options and risks were discussed with the patient.                            All questions were answered, and informed consent                            was obtained. Prior Anticoagulants: The patient has                            taken no previous anticoagulant or antiplatelet                            agents. ASA Grade Assessment: II - A patient with                            mild systemic disease. After reviewing the risks                            and benefits, the patient was deemed in                            satisfactory condition to undergo the procedure.                           After obtaining informed consent, the colonoscope  was passed under direct vision. Throughout the                            procedure, the patient's blood pressure, pulse, and                            oxygen saturations were monitored continuously. The                            Colonoscope was introduced through the anus and                            advanced to the the cecum, identified by                            appendiceal orifice and  ileocecal valve. The                            colonoscopy was performed without difficulty. The                            patient tolerated the procedure well. The quality                            of the bowel preparation was excellent. The                            ileocecal valve, appendiceal orifice, and rectum                            were photographed. Scope In: 11:03:39 AM Scope Out: 11:18:34 AM Scope Withdrawal Time: 0 hours 7 minutes 9 seconds  Total Procedure Duration: 0 hours 14 minutes 55 seconds  Findings:                 The perianal and digital rectal examinations were                            normal.                           Non-bleeding internal hemorrhoids were found during                            retroflexion. The hemorrhoids were small.                           The exam was otherwise without abnormality. Complications:            No immediate complications. Estimated Blood Loss:     Estimated blood loss: none. Impression:               - Non-bleeding internal hemorrhoids.                           - The examination was otherwise normal.                           -  No specimens collected. Recommendation:           - Patient has a contact number available for                            emergencies. The signs and symptoms of potential                            delayed complications were discussed with the                            patient. Return to normal activities tomorrow.                            Written discharge instructions were provided to the                            patient.                           - Resume previous diet.                           - Continue present medications.                           - Repeat colonoscopy in 10 years for screening                            purposes. Napoleon Form, MD 11/10/2017 11:21:28 AM This report has been signed electronically.

## 2017-11-10 NOTE — Patient Instructions (Signed)
Handout given on hemorrhoids   YOU HAD AN ENDOSCOPIC PROCEDURE TODAY AT THE [facility]:   Refer to the procedure report that was given to you for any specific questions about what was found during the examination.  If the procedure report does not answer your questions, please call your gastroenterologist to clarify.  If you requested that your care partner not be given the details of your procedure findings, then the procedure report has been included in a sealed envelope for you to review at your convenience later.  YOU SHOULD EXPECT: Some feelings of bloating in the abdomen. Passage of more gas than usual.  Walking can help get rid of the air that was put into your GI tract during the procedure and reduce the bloating. If you had a lower endoscopy (such as a colonoscopy or flexible sigmoidoscopy) you may notice spotting of blood in your stool or on the toilet paper. If you underwent a bowel prep for your procedure, you may not have a normal bowel movement for a few days.  Please Note:  You might notice some irritation and congestion in your nose or some drainage.  This is from the oxygen used during your procedure.  There is no need for concern and it should clear up in a day or so.  SYMPTOMS TO REPORT IMMEDIATELY:   Following lower endoscopy (colonoscopy or flexible sigmoidoscopy):  Excessive amounts of blood in the stool  Significant tenderness or worsening of abdominal pains  Swelling of the abdomen that is new, acute  Fever of 100F or higher   For urgent or emergent issues, a gastroenterologist can be reached at any hour by calling [phone].   DIET:  We do recommend a small meal at first, but then you may proceed to your regular diet.  Drink plenty of fluids but you should avoid alcoholic beverages for 24 hours.  ACTIVITY:  You should plan to take it easy for the rest of today and you should NOT DRIVE or use heavy machinery until tomorrow (because of the sedation  medicines used during the test).    FOLLOW UP: Our staff will call the number listed on your records the next business day following your procedure to check on you and address any questions or concerns that you may have regarding the information given to you following your procedure. If we do not reach you, we will leave a message.  However, if you are feeling well and you are not experiencing any problems, there is no need to return our call.  We will assume that you have returned to your regular daily activities without incident.  If any biopsies were taken you will be contacted by phone or by letter within the next 1-3 weeks.  Please call us at [phone] if you have not heard about the biopsies in 3 weeks.    SIGNATURES/CONFIDENTIALITY: You and/or your care partner have signed paperwork which will be entered into your electronic medical record.  These signatures attest to the fact that that the information above on your After Visit Summary has been reviewed and is understood.  Full responsibility of the confidentiality of this discharge information lies with you and/or your care-partner. 

## 2017-11-10 NOTE — Progress Notes (Signed)
Pt's states no medical or surgical changes since previsit or office visit. 

## 2017-11-11 ENCOUNTER — Telehealth: Payer: Self-pay | Admitting: *Deleted

## 2017-11-11 NOTE — Telephone Encounter (Signed)
  Follow up Call-  Call back number 11/10/2017  Post procedure Call Back phone  # [phone]  Permission to leave phone message Yes  Some recent data might be hidden     Patient questions:  Do you have a fever, pain , or abdominal swelling? No. Pain Score  0 *  Have you tolerated food without any problems? Yes.    Have you been able to return to your normal activities? Yes.    Do you have any questions about your discharge instructions: Diet   No. Medications  No. Follow up visit  No.  Do you have questions or concerns about your Care? No.  Actions: * If pain score is 4 or above: No action needed, pain <4.

## 2018-03-17 ENCOUNTER — Ambulatory Visit (INDEPENDENT_AMBULATORY_CARE_PROVIDER_SITE_OTHER): Payer: BLUE CROSS/BLUE SHIELD

## 2018-03-17 ENCOUNTER — Ambulatory Visit (INDEPENDENT_AMBULATORY_CARE_PROVIDER_SITE_OTHER): Payer: BLUE CROSS/BLUE SHIELD | Admitting: Podiatry

## 2018-03-17 ENCOUNTER — Encounter: Payer: Self-pay | Admitting: Podiatry

## 2018-03-17 DIAGNOSIS — M722 Plantar fascial fibromatosis: Secondary | ICD-10-CM

## 2018-03-17 MED ORDER — MELOXICAM 15 MG PO TABS: 15.0000 mg | ORAL_TABLET | Freq: Every day | ORAL | 1 refills | Status: AC

## 2018-03-21 NOTE — Progress Notes (Signed)
   Subjective: 52 year old female presenting today as a new patient with a chief complaint of intermittent pain to the right arch and heel that began 2 months ago. She states the pain is worse in the morning when she first gets out of bed. She has not had any treatment for her symptoms.  Patient presents today for further treatment and evaluation.  Past Medical History:  Diagnosis Date  . Allergy   . Breast mass    Left breast mass felt by Dr   . Hypertension   . Thyroid disease      Objective: Physical Exam General: The patient is alert and oriented x3 in no acute distress.  Dermatology: Skin is warm, dry and supple bilateral lower extremities. Negative for open lesions or macerations bilateral.   Vascular: Dorsalis Pedis and Posterior Tibial pulses palpable bilateral.  Capillary fill time is immediate to all digits.  Neurological: Epicritic and protective threshold intact bilateral.   Musculoskeletal: Tenderness to palpation to the plantar aspect of the right heel along the plantar fascia. All other joints range of motion within normal limits bilateral. Strength 5/5 in all groups bilateral.   Radiographic exam: Normal osseous mineralization. Joint spaces preserved. No fracture/dislocation/boney destruction. No other soft tissue abnormalities or radiopaque foreign bodies.   Assessment: 1. Plantar fasciitis right 2. Pain in right foot  Plan of Care:  1. Patient evaluated. Xrays reviewed.   2. Injection of 0.5cc Celestone soluspan injected into the right plantar fascia  3. Rx for Meloxicam provided to patient.  4. Plantar fascial band(s) dispensed 5. Instructed patient regarding therapies and modalities at home to alleviate symptoms.  6. Return to clinic in 4 weeks.     Felecia Shelling, DPM [facility]  Dr. Felecia Shelling, DPM    [address] [place], [place] [address]                Office [phone]  Fax [phone]

## 2018-04-14 ENCOUNTER — Ambulatory Visit (INDEPENDENT_AMBULATORY_CARE_PROVIDER_SITE_OTHER): Payer: BLUE CROSS/BLUE SHIELD | Admitting: Podiatry

## 2018-04-14 DIAGNOSIS — M722 Plantar fascial fibromatosis: Secondary | ICD-10-CM

## 2018-04-19 NOTE — Progress Notes (Signed)
   Subjective: 52 year old female presenting today for follow up evaluation of plantar fasciitis of the right foot. She states the pain has improved but not completely resolved. She has been wearing the fascial brace but has not yet started taking the Mobic. Patient presents today for further treatment and evaluation.  Past Medical History:  Diagnosis Date  . Allergy   . Breast mass    Left breast mass felt by Dr   . Hypertension   . Thyroid disease      Objective: Physical Exam General: The patient is alert and oriented x3 in no acute distress.  Dermatology: Skin is warm, dry and supple bilateral lower extremities. Negative for open lesions or macerations bilateral.   Vascular: Dorsalis Pedis and Posterior Tibial pulses palpable bilateral.  Capillary fill time is immediate to all digits.  Neurological: Epicritic and protective threshold intact bilateral.   Musculoskeletal: Tenderness to palpation to the plantar aspect of the right heel along the plantar fascia. All other joints range of motion within normal limits bilateral. Strength 5/5 in all groups bilateral.    Assessment: 1. Plantar fasciitis right 2. Pain in right foot  Plan of Care:  1. Patient evaluated.  2. Injection of 0.5cc Celestone soluspan injected into the right plantar fascia  3. Begin taking Mobic. Patient has not started it yet.  4. Continue wearing plantar fascial band. 5. Return to clinic in 4 weeks.    Going to [facility] July 4th. Son has a basketball tournament.    Felecia Shelling, DPM [facility]  Dr. Felecia Shelling, DPM    [address] [place], [place] [address]                Office [phone]  Fax [phone]

## 2018-04-20 ENCOUNTER — Telehealth: Payer: Self-pay | Admitting: Podiatry

## 2018-04-20 MED ORDER — MELOXICAM 15 MG PO TABS: 15.0000 mg | ORAL_TABLET | Freq: Every day | ORAL | 2 refills | Status: AC

## 2018-04-20 MED ORDER — MELOXICAM 15 MG PO TABS: 15.0000 mg | ORAL_TABLET | Freq: Every day | ORAL | 0 refills | Status: DC

## 2018-04-20 NOTE — Telephone Encounter (Signed)
Patient called to see if she can get her medication refilled. If you can call her back at [phone]

## 2018-04-20 NOTE — Addendum Note (Signed)
Addended by: Alphia Kava D on: 04/20/2018 12:02 PM   Modules accepted: Orders

## 2018-04-20 NOTE — Telephone Encounter (Signed)
I informed pt of Dr. Logan Bores refills for the meloxicam.

## 2018-05-17 ENCOUNTER — Ambulatory Visit: Payer: BLUE CROSS/BLUE SHIELD | Admitting: Podiatry

## 2018-05-17 DIAGNOSIS — M722 Plantar fascial fibromatosis: Secondary | ICD-10-CM | POA: Diagnosis not present

## 2018-05-21 NOTE — Progress Notes (Signed)
   Subjective: 52 year old female presenting today for follow up evaluation of plantar fasciitis of the right foot. She states the pain has improved significantly but has not resolved completely. She has been taking Mobic and wearing the plantar fascial brace as directed. Patient presents today for further treatment and evaluation.  Past Medical History:  Diagnosis Date  . Allergy   . Breast mass    Left breast mass felt by Dr   . Hypertension   . Thyroid disease      Objective: Physical Exam General: The patient is alert and oriented x3 in no acute distress.  Dermatology: Skin is warm, dry and supple bilateral lower extremities. Negative for open lesions or macerations bilateral.   Vascular: Dorsalis Pedis and Posterior Tibial pulses palpable bilateral.  Capillary fill time is immediate to all digits.  Neurological: Epicritic and protective threshold intact bilateral.   Musculoskeletal: Tenderness to palpation to the plantar aspect of the right heel along the plantar fascia. All other joints range of motion within normal limits bilateral. Strength 5/5 in all groups bilateral.    Assessment: 1. Plantar fasciitis right - improved    Plan of Care:  1. Patient evaluated.  2. Injection of 0.5cc Celestone soluspan injected into the right plantar fascia  3. Continue taking Mobic and wearing the plantar fascial brace.  4. Recommended good shoe gear.  5. Return to clinic as needed.    [place] [facility] [place]. Son is 51 years old and plays [employer] basketball.    Felecia ShellingBrent M. Ulonda Klosowski, DPM [facility]  Dr. Felecia ShellingBrent M. Corry Ihnen, DPM    [address]                                        [place], [place] [id number]                Office [phone]  Fax [phone]

## 2019-04-28 IMAGING — MG 2D DIGITAL DIAGNOSTIC UNILATERAL RIGHT MAMMOGRAM WITH CAD AND AD
3 series · 3 of 7 positions shown · non-contrast
Comparison: Previous exam(s).

CLINICAL DATA: 50-year-old female presenting for evaluation of
focal pain in the right breast at 4 o'clock, as well as a right
breast lump at 6 o'clock identified on physical exam.

EXAM:
2D DIGITAL DIAGNOSTIC UNILATERAL RIGHT MAMMOGRAM WITH CAD AND
ADJUNCT TOMO
RIGHT BREAST ULTRASOUND

[R TAN synth-2D]
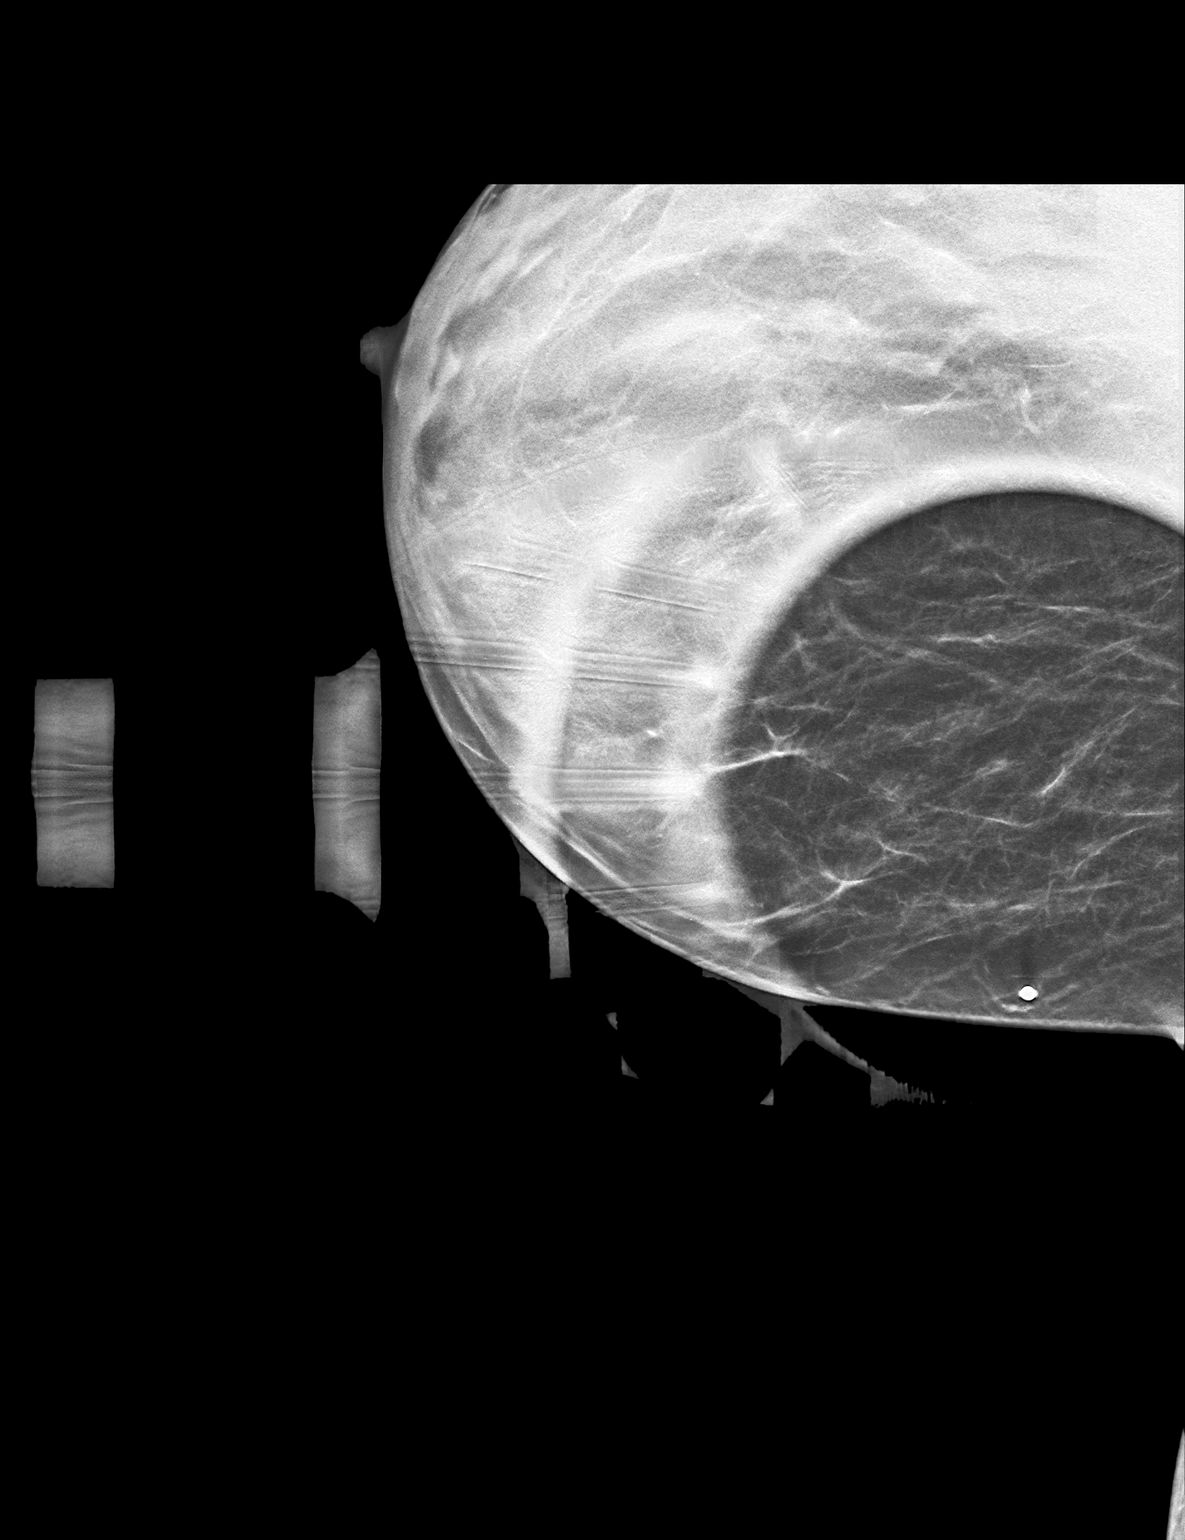

[R TAN]
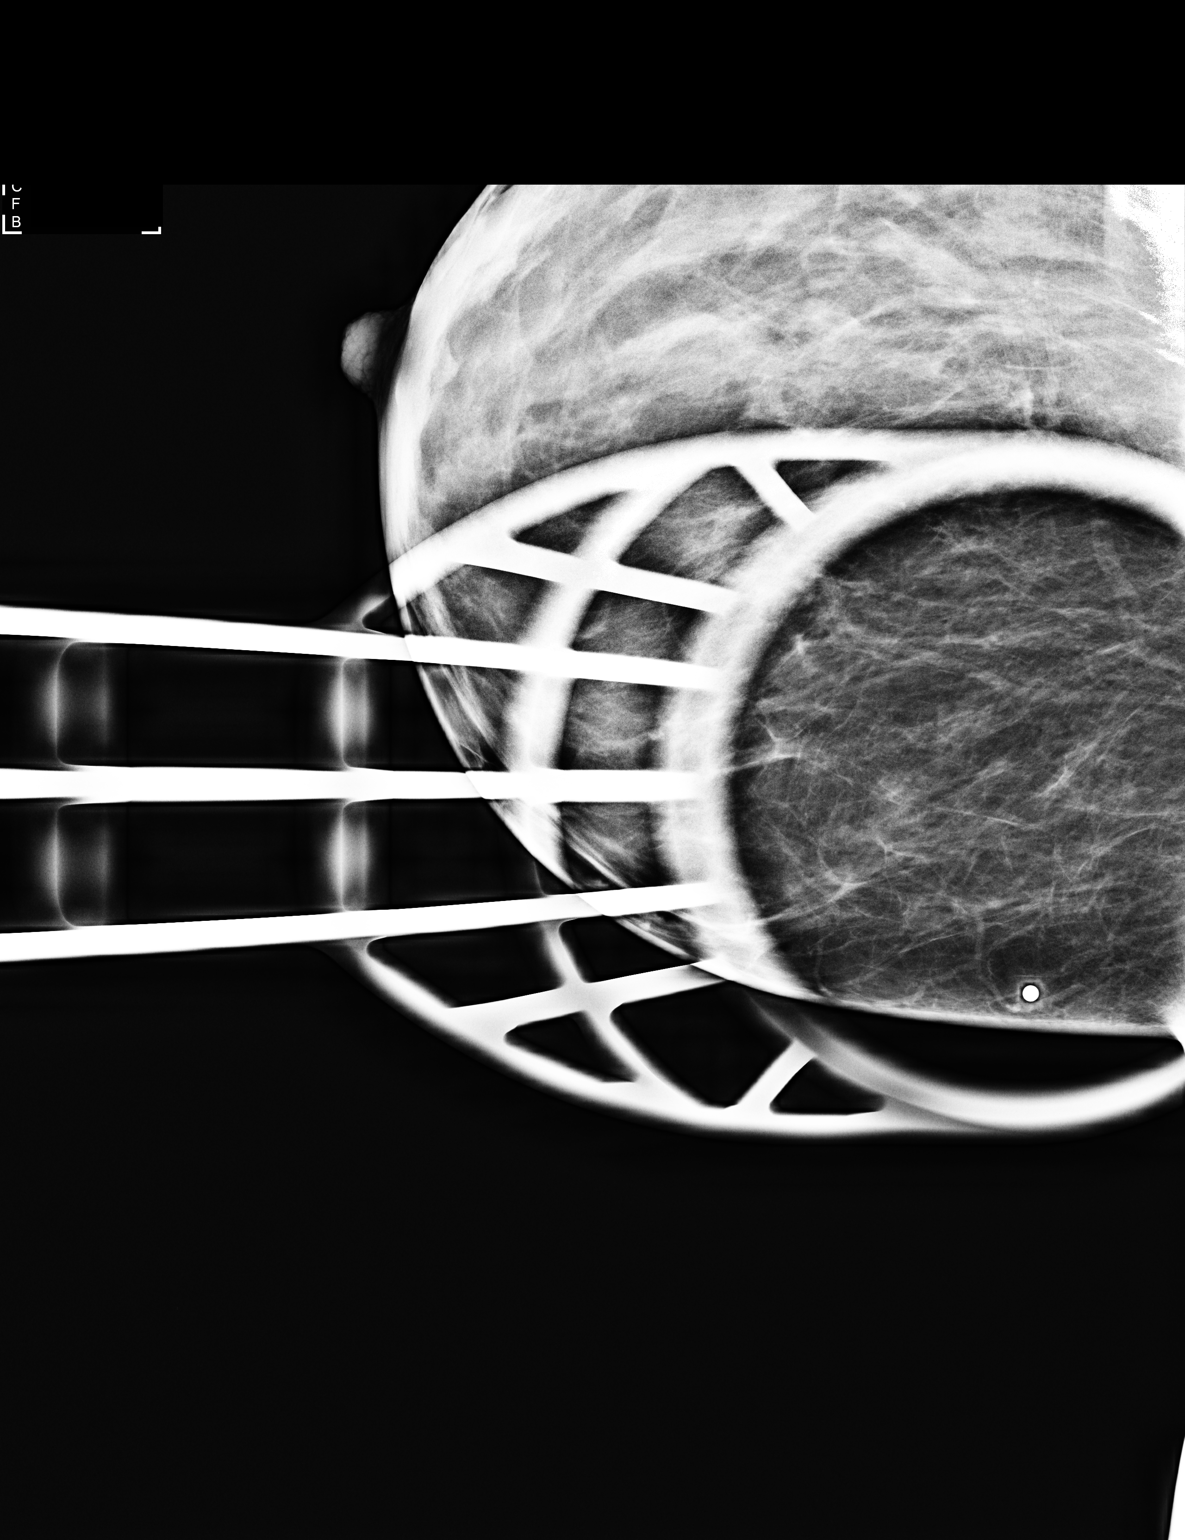

[R TAN tomo · tomo slice 23/46.0]
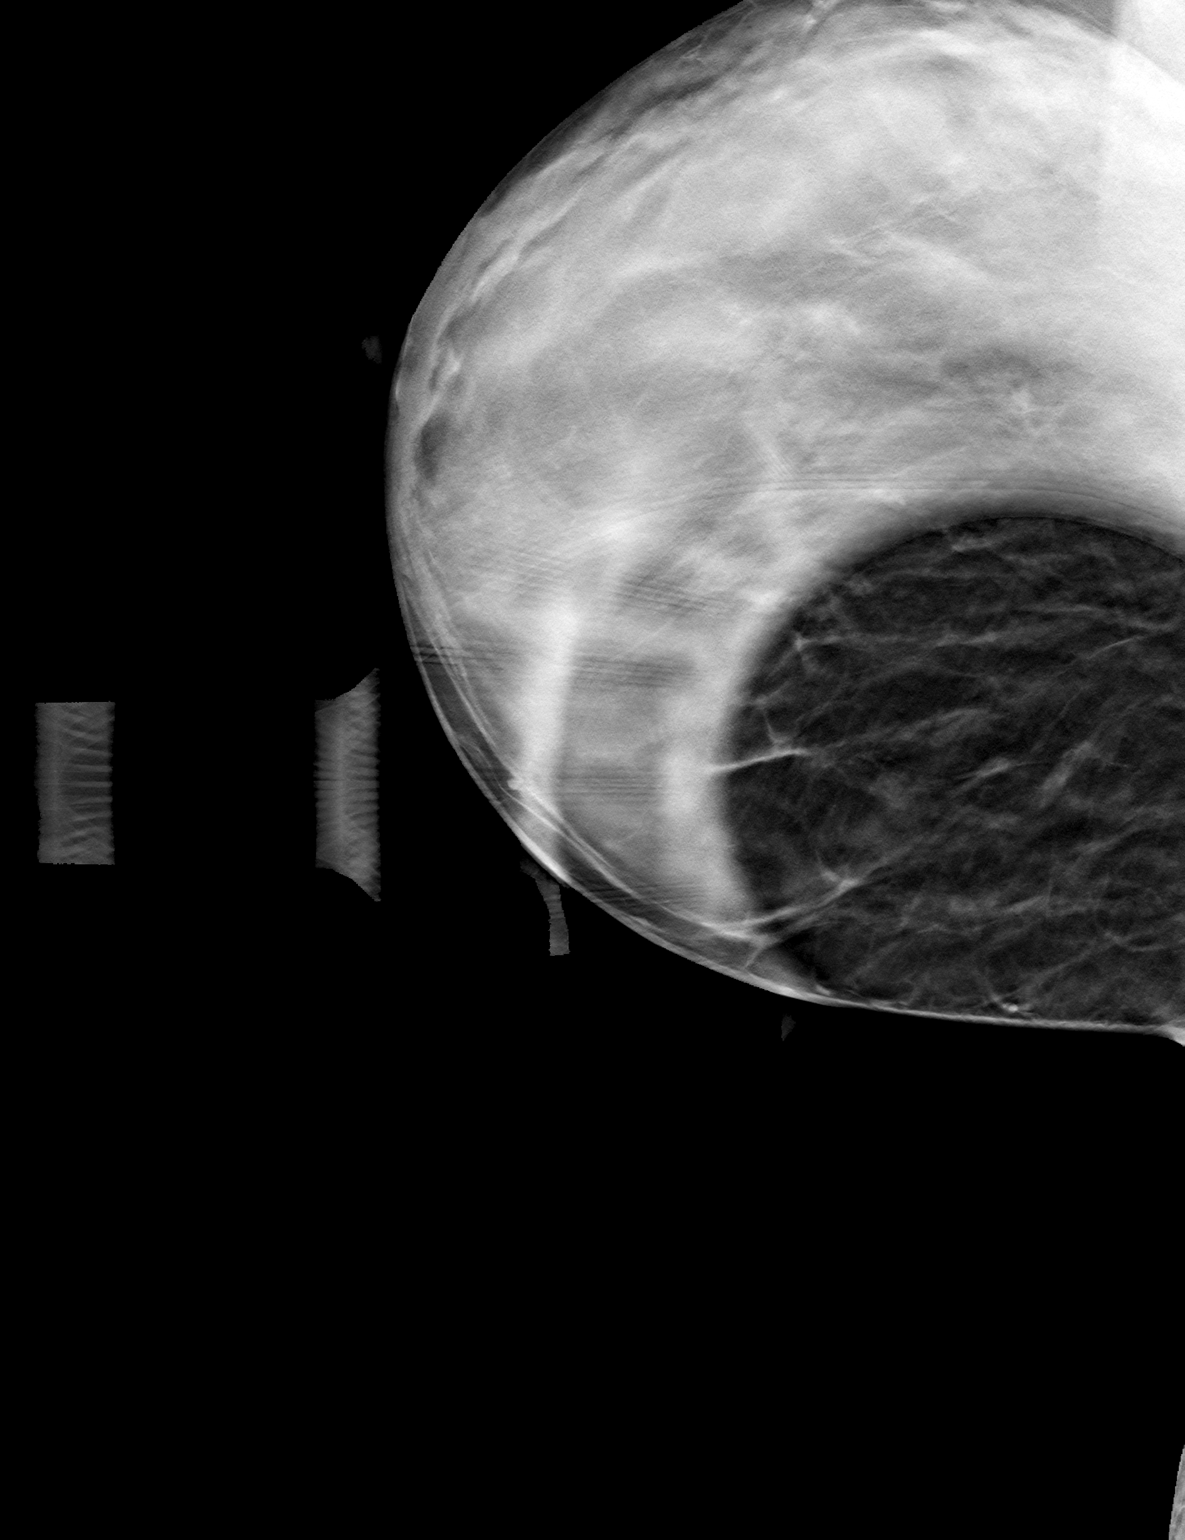

[3 of 7 positions shown; findings below may reference images not displayed]

ACR Breast Density Category b: There are scattered areas of
fibroglandular density.
FINDINGS: There are no suspicious mammographic findings identified on the spot
compression tomosynthesis images at the site of focal pain in the
medial right breast.

Mammographic images were processed with CAD.

No discrete palpable masses are identified in the inferior aspect of
the right breast at the site of palpable concern, nor in the area of
tenderness in the lower-inner right breast.

Ultrasound targeted to the inferior and lower inner quadrant of the
right breast demonstrates normal fibroglandular tissue. No masses or
suspicious areas of shadowing are identified.
IMPRESSION: 1. There are no mammographic or targeted sonographic abnormalities
at the site of tenderness in the lower-inner right breast.

2. There are no mammographic or targeted sonographic abnormalities
in the inferior right breast at the site of palpable concern.

RECOMMENDATION:
1. Clinical follow-up recommended for the tender and palpable areas
of concern in the right breast. Any further workup should be based
on clinical grounds.

2.  Screening mammogram in one year.(Code:QO-4-V5D)

I have discussed the findings and recommendations with the patient.
Results were also provided in writing at the conclusion of the
visit. If applicable, a reminder letter will be sent to the patient
regarding the next appointment.

BI-RADS CATEGORY  1: Negative.

## 2024-04-05 ENCOUNTER — Other Ambulatory Visit: Payer: Self-pay | Admitting: Obstetrics and Gynecology

## 2024-04-05 DIAGNOSIS — Z1231 Encounter for screening mammogram for malignant neoplasm of breast: Secondary | ICD-10-CM

## 2024-07-12 ENCOUNTER — Ambulatory Visit
Admission: RE | Admit: 2024-07-12 | Discharge: 2024-07-12 | Disposition: A | Discharge status: Home / Self Care | Source: Ambulatory Visit | Attending: Obstetrics and Gynecology | Admitting: Obstetrics and Gynecology

## 2024-07-12 DIAGNOSIS — Z1231 Encounter for screening mammogram for malignant neoplasm of breast: Secondary | ICD-10-CM
